# Patient Record
Sex: Male | Born: 1960 | Race: White | Hispanic: No | Marital: Married | Smoking: Current every day smoker
Health system: Southern US, Community
[De-identification: ages and names within clinical notes are randomized; demographics above are authoritative.]

## PROBLEM LIST (undated history)

## (undated) DIAGNOSIS — I639 Cerebral infarction, unspecified: Secondary | ICD-10-CM

## (undated) DIAGNOSIS — M1990 Unspecified osteoarthritis, unspecified site: Secondary | ICD-10-CM

## (undated) DIAGNOSIS — M549 Dorsalgia, unspecified: Secondary | ICD-10-CM

## (undated) DIAGNOSIS — I10 Essential (primary) hypertension: Secondary | ICD-10-CM

## (undated) HISTORY — PX: HERNIA REPAIR: SHX51

## (undated) HISTORY — PX: BACK SURGERY: SHX140

---

## 2017-12-25 ENCOUNTER — Emergency Department (HOSPITAL_BASED_OUTPATIENT_CLINIC_OR_DEPARTMENT_OTHER): Payer: Medicare HMO

## 2017-12-25 ENCOUNTER — Encounter (HOSPITAL_BASED_OUTPATIENT_CLINIC_OR_DEPARTMENT_OTHER): Payer: Self-pay | Admitting: Emergency Medicine

## 2017-12-25 ENCOUNTER — Emergency Department (HOSPITAL_BASED_OUTPATIENT_CLINIC_OR_DEPARTMENT_OTHER)
Admission: EM | Admit: 2017-12-25 | Discharge: 2017-12-25 | Disposition: A | Discharge status: Home / Self Care | Payer: Medicare HMO | Attending: Emergency Medicine | Admitting: Emergency Medicine

## 2017-12-25 ENCOUNTER — Other Ambulatory Visit: Payer: Self-pay

## 2017-12-25 DIAGNOSIS — M25422 Effusion, left elbow: Secondary | ICD-10-CM | POA: Insufficient documentation

## 2017-12-25 DIAGNOSIS — I10 Essential (primary) hypertension: Secondary | ICD-10-CM | POA: Insufficient documentation

## 2017-12-25 DIAGNOSIS — F17200 Nicotine dependence, unspecified, uncomplicated: Secondary | ICD-10-CM | POA: Diagnosis not present

## 2017-12-25 DIAGNOSIS — Z79899 Other long term (current) drug therapy: Secondary | ICD-10-CM | POA: Diagnosis not present

## 2017-12-25 DIAGNOSIS — W19XXXA Unspecified fall, initial encounter: Secondary | ICD-10-CM | POA: Diagnosis not present

## 2017-12-25 DIAGNOSIS — Y999 Unspecified external cause status: Secondary | ICD-10-CM | POA: Diagnosis not present

## 2017-12-25 DIAGNOSIS — R202 Paresthesia of skin: Secondary | ICD-10-CM | POA: Insufficient documentation

## 2017-12-25 DIAGNOSIS — Z7902 Long term (current) use of antithrombotics/antiplatelets: Secondary | ICD-10-CM | POA: Diagnosis not present

## 2017-12-25 DIAGNOSIS — Z8673 Personal history of transient ischemic attack (TIA), and cerebral infarction without residual deficits: Secondary | ICD-10-CM | POA: Insufficient documentation

## 2017-12-25 DIAGNOSIS — Y929 Unspecified place or not applicable: Secondary | ICD-10-CM | POA: Diagnosis not present

## 2017-12-25 DIAGNOSIS — S59902A Unspecified injury of left elbow, initial encounter: Secondary | ICD-10-CM | POA: Diagnosis present

## 2017-12-25 DIAGNOSIS — Y939 Activity, unspecified: Secondary | ICD-10-CM | POA: Diagnosis not present

## 2017-12-25 HISTORY — DX: Cerebral infarction, unspecified: I63.9

## 2017-12-25 HISTORY — DX: Unspecified osteoarthritis, unspecified site: M19.90

## 2017-12-25 HISTORY — DX: Dorsalgia, unspecified: M54.9

## 2017-12-25 HISTORY — DX: Essential (primary) hypertension: I10

## 2017-12-25 MED ORDER — HYDROMORPHONE HCL 1 MG/ML IJ SOLN
2.0000 mg | Freq: Once | INTRAMUSCULAR | Status: AC
  Administered 2017-12-25: 2 mg via INTRAMUSCULAR
  Filled 2017-12-25: qty 2

## 2017-12-25 MED ORDER — OXYCODONE HCL 15 MG PO TABS: 15.0000 mg | ORAL_TABLET | Freq: Four times a day (QID) | ORAL | 0 refills | Status: AC | PRN

## 2017-12-25 NOTE — ED Provider Notes (Signed)
[facility] EMERGENCY DEPARTMENT Provider Note   CSN: [id number] Arrival date & time: 12/25/17  1545     History   Chief Complaint Chief Complaint  Patient presents with  . Fall    HPI Alejandro Hunt is a 57 y.o. male.  Patient with history of stroke, on Plavix, chronic back pain on oxycodone 15mg  4x/day -- presents with acute onset of left elbow pain and swelling starting yesterday after he fell approximately 3-1/2 feet off of a wall onto his elbow.  Patient states that he did not hit his head or lose consciousness.  Patient has had severe pain in his left arm that has been giving him trouble sleeping.  Pain is worse with movement.  He states that his left hand has become more weak since the accident.  He has some tingling in his hands, most pronounced in the index and middle fingers.  Pain in the elbow is worse when he moves his wrist.  He had an x-ray performed by his primary care physician yesterday and he was told to come back for repeat imaging next week.  He was placed in a sling.      Past Medical History:  Diagnosis Date  . Arthritis   . Back pain   . Hypertension   . Stroke [facility])     There are no active problems to display for this patient.   Past Surgical History:  Procedure Laterality Date  . BACK SURGERY    . HERNIA REPAIR          Home Medications    Prior to Admission medications   Medication Sig Start Date End Date Taking? Authorizing Provider  clopidogrel (PLAVIX) 75 MG tablet Take 75 mg by mouth daily.   Yes [provider]  lisinopril (PRINIVIL,ZESTRIL) 5 MG tablet Take 5 mg by mouth daily.   Yes [provider]  oxyCODONE (ROXICODONE) 15 MG immediate release tablet Take 15 mg by mouth 4 (four) times daily as needed for pain.   Yes [provider]  alendronate (FOSAMAX) 70 MG tablet Take 1 tablet by mouth every 7 (seven) days.    [provider]    Family History History reviewed. No pertinent  family history.  Social History Social History   Tobacco Use  . Smoking status: Current Every Day Smoker  . Smokeless tobacco: Never Used  Substance Use Topics  . Alcohol use: Yes    Alcohol/week: 1.8 oz    Types: 3 Cans of beer per week  . Drug use: Never     Allergies   Gabapentin and Lorazepam   Review of Systems Review of Systems  Constitutional: Negative for activity change.  Musculoskeletal: Positive for arthralgias and joint swelling. Negative for back pain, gait problem and neck pain.  Skin: Negative for wound.  Neurological: Positive for weakness and numbness.     Physical Exam Updated Vital Signs BP 125/89 (BP Location: Right Arm)   Pulse (!) 106   Temp 97.9 F (36.6 C) (Oral)   Resp 20   Ht 5\' 8"  (1.727 m)   Wt 63.5 kg (140 lb)   SpO2 97%   BMI 21.29 kg/m   Physical Exam  Constitutional: He appears well-developed and well-nourished.  HENT:  Head: Normocephalic and atraumatic.  Mouth/Throat: Oropharynx is clear and moist.  Eyes: Conjunctivae are normal.  Neck: Normal range of motion. Neck supple.  Cardiovascular: Normal pulses. Exam reveals no decreased pulses.  Pulses:      Radial pulses  are 2+ on the right side, and 2+ on the left side.  Musculoskeletal: He exhibits tenderness. He exhibits no edema.       Left shoulder: Normal.       Left elbow: He exhibits decreased range of motion, swelling and effusion. Tenderness found.       Left wrist: He exhibits tenderness. He exhibits normal range of motion and no bony tenderness.       Left upper arm: Normal.       Left forearm: Normal.       Left hand: Normal.  Patient reports some tingling but no full numbness in index and middle finger of the left hand.  No sensation deficit of the forearm.  Decreased range of motion in flexion and extension of the wrist secondary to pain in the elbow.  Patient has decreased grip strength on this side, also likely secondary to pain.  Neurological: He is alert. No  sensory deficit.  Motor, sensation, and vascular distal to the injury is fully intact.   Skin: Skin is warm and dry.  Psychiatric: He has a normal mood and affect.  Nursing note and vitals reviewed.    ED Treatments / Results  Labs (all labs ordered are listed, but only abnormal results are displayed) Labs Reviewed - No data to display  EKG None  Radiology Dg Elbow Complete Left  Result Date: 12/25/2017 CLINICAL DATA:  Elbow pain with swelling and limited range of motion after a fall EXAM: LEFT ELBOW - COMPLETE 3+ VIEW COMPARISON:  None. FINDINGS: Large elbow effusion. No dislocation. No discrete fracture lucency. IMPRESSION: No discrete fracture lucency is identified, however there is a large elbow effusion and an occult fracture is suspected. Electronically Signed   By: Jasmine Pang M.D.   On: 12/25/2017 16:22    Procedures Procedures (including critical care time)  Medications Ordered in ED Medications  HYDROmorphone (DILAUDID) injection 2 mg (2 mg Intramuscular Given 12/25/17 1642)     Initial Impression / Assessment and Plan / ED Course  I have reviewed the triage vital signs and the nursing notes.  Pertinent labs & imaging results that were available during my care of the patient were reviewed by me and considered in my medical decision making (see chart for details).     Patient seen and examined. Work-up initiated. Medications ordered.   Vital signs reviewed and are as follows: BP 125/89 (BP Location: Right Arm)   Pulse (!) 106   Temp 97.9 F (36.6 C) (Oral)   Resp 20   Ht 5\' 8"  (1.727 m)   Wt 63.5 kg (140 lb)   SpO2 97%   BMI 21.29 kg/m   Patient discussed with and seen by Dr. Eudelia Bunch.  X-ray reviewed.  Given positive sail sign and posterior fat pad, highly suspicious for occult fracture.  For this reason patient will be placed in a posterior long-arm splint.  Patient treated with IM hydromorphone given acute pain and chronic narcotic use.  I reviewed  [place] substance reporting database.  Patient receives pain medication from one provider.   Discussed pain control with Dr. Eudelia Bunch.  Patient is honest about his pain medication use.  Will give 3-day course of pain medication given his acute findings and likely fracture.  Patient feels much better after pain control here.  Patient counseled on use of narcotic pain medications. Counseled not to combine these medications with others containing tylenol. Urged not to drink alcohol, drive, or perform any other activities that requires  focus while taking these medications. The patient verbalizes understanding and agrees with the plan.  I evaluated patient after placement of splint.  He has preserved sensation and capillary refill distally.  Patient given orthopedic referral and will follow up with his primary care doctor on Monday as planned.  Final Clinical Impressions(s) / ED Diagnoses   Final diagnoses:  Effusion of left elbow   Patient with left elbow effusion, likely occult fracture of the elbow.  Patient splinted.  CMS intact distally.  No signs of compartment syndrome.  Normal pulses.  Pain control made difficult due to chronic use of narcotics.  ED Discharge Orders        Ordered    oxyCODONE (ROXICODONE) 15 MG immediate release tablet  Every 6 hours PRN     12/25/17 1730       Renne CriglerGeiple, Florance Paolillo, PA-C 12/25/17 1752

## 2017-12-25 NOTE — Discharge Instructions (Signed)
Please read and follow all provided instructions.  Your diagnoses today include:  1. Effusion of left elbow     Tests performed today include:  An x-ray of the affected area -shows a lot of swelling, no definite broken bones but we have high suspicion for broken bone that we cannot see yet.  Vital signs. See below for your results today.   Medications prescribed:   Oxycodone - narcotic pain medication  DO NOT drive or perform any activities that require you to be awake and alert because this medicine can make you drowsy.   Take any prescribed medications only as directed.  Home care instructions:   Follow any educational materials contained in this packet  Follow R.I.C.E. Protocol:  R - rest your injury   I  - use ice on injury without applying directly to skin  C - compress injury with bandage or splint  E - elevate the injury as much as possible  Follow-up instructions: Please follow-up with your primary care provider or the provided orthopedic physician (bone specialist) next week. In this case you may have a more severe injury that requires further care.  Please let your doctor know about the additional pain medication that we prescribed to you today.  Please take this as prescribed.  Return instructions:   Please return if your fingers are numb or tingling, appear gray or blue, or you have severe pain (also elevate the arm and loosen splint or wrap if you were given one)  Please return to the Emergency Department if you experience worsening symptoms.   Please return if you have any other emergent concerns.  Additional Information:  Your vital signs today were: BP 125/89 (BP Location: Right Arm)    Pulse (!) 106    Temp 97.9 F (36.6 C) (Oral)    Resp 20    Ht 5\' 8"  (1.727 m)    Wt 63.5 kg (140 lb)    SpO2 97%    BMI 21.29 kg/m  If your blood pressure (BP) was elevated above 135/85 this visit, please have this repeated by your doctor within one  month. --------------

## 2017-12-25 NOTE — ED Provider Notes (Signed)
Medical screening examination/treatment/procedure(s) were conducted as a shared visit with non-physician practitioner(s) and myself.  I personally evaluated the patient during the encounter. Briefly, the patient is a 57 y.o. male here with left elbow pain following mech fall. Plain film concerning for fracture. Will splint. Pain related decreased strength. The patient is safe for discharge with strict return precautions. .    EKG Interpretation None           Ha Shannahan, Amadeo GarnetPedro Eduardo, MD 12/25/17 1719

## 2017-12-25 NOTE — ED Notes (Signed)
Pt verbalizes understanding of d/c instructions and denies any further needs at this time. 

## 2017-12-25 NOTE — ED Triage Notes (Signed)
Pt states he fell yesterday-pain to left elbow-was seen by PCP-had xray-"couldn't tell"-is wearing sling-NAD-steady gait

## 2017-12-30 ENCOUNTER — Ambulatory Visit (INDEPENDENT_AMBULATORY_CARE_PROVIDER_SITE_OTHER): Payer: Medicare HMO | Admitting: Orthopaedic Surgery

## 2017-12-30 ENCOUNTER — Encounter (INDEPENDENT_AMBULATORY_CARE_PROVIDER_SITE_OTHER): Payer: Self-pay | Admitting: Orthopaedic Surgery

## 2017-12-30 ENCOUNTER — Ambulatory Visit (INDEPENDENT_AMBULATORY_CARE_PROVIDER_SITE_OTHER): Payer: Medicare HMO

## 2017-12-30 DIAGNOSIS — S52125A Nondisplaced fracture of head of left radius, initial encounter for closed fracture: Secondary | ICD-10-CM

## 2017-12-30 DIAGNOSIS — M25532 Pain in left wrist: Secondary | ICD-10-CM

## 2017-12-30 NOTE — Progress Notes (Signed)
Office Visit Note   Patient: Alejandro HickmanLawrence Faye           Date of Birth: [birth year]           MRN: [id number] Visit Date: 12/30/2017              [facility] [address] [place], [place] [id number] [facility]   Assessment & Plan: Visit Diagnoses:  1. Pain in left wrist   2. Closed nondisplaced fracture of head of left radius, initial encounter     Plan: Impression is nondisplaced radial neck fracture.  He has a large elbow effusion.  I recommend sling immobilization for a total of 10 days or so and then begin gentle range of motion with OT.  I would like him to follow-up in a week with repeat 2 view x-rays of the left elbow.  He will need a referral to OT at that time to begin range of motion.  He needs to be nonweightbearing for 4-6 weeks.  Follow-Up Instructions: Return in about 1 week (around 01/06/2018).   Orders:  Orders Placed This Encounter  Procedures  . XR Wrist Complete Left   No orders of the defined types were placed in this encounter.     Procedures: No procedures performed   Clinical Data: No additional findings.   Subjective: Chief Complaint  Patient presents with  . Left Elbow - Pain    Patient is a 57 year old gentleman who fell onto his left upper extremity on 12/25/2017.  He fell off of a brick wall.  He endorses pain in his left elbow.  Denies any numbness and tingling.  He is taking oxycodone and Motrin.   Review of Systems  Constitutional: Negative.   All other systems reviewed and are negative.    Objective: Vital Signs: There were no vitals taken for this visit.  Physical Exam  Constitutional: He is oriented to person, place, and time. He appears well-developed and well-nourished.  HENT:  Head: Normocephalic and atraumatic.  Eyes: Pupils are equal, round, and reactive to light.  Neck: Neck supple.  Pulmonary/Chest: Effort normal.  Abdominal: Soft.  Musculoskeletal: Normal range of  motion.  Neurological: He is alert and oriented to person, place, and time.  Skin: Skin is warm.  Psychiatric: He has a normal mood and affect. His behavior is normal. Judgment and thought content normal.  Nursing note and vitals reviewed.   Ortho Exam Left elbow exam shows guarding with range of motion testing.  There is no focal motor or sensory deficits.  He is neurovascular intact distally.  Radial head is tender. Specialty Comments:  No specialty comments available.  Imaging: Xr Wrist Complete Left  Result Date: 12/30/2017 No acute or structural abnormalities.    PMFS History: There are no active problems to display for this patient.  Past Medical History:  Diagnosis Date  . Arthritis   . Back pain   . Hypertension   . Stroke [facility])     History reviewed. No pertinent family history.  Past Surgical History:  Procedure Laterality Date  . BACK SURGERY    . HERNIA REPAIR     Social History   Occupational History  . Not on file  Tobacco Use  . Smoking status: Current Every Day Smoker  . Smokeless tobacco: Never Used  Substance and Sexual Activity  . Alcohol use: Yes    Alcohol/week: 1.8 oz    Types: 3 Cans of beer per week  .  Drug use: Never  . Sexual activity: Not on file

## 2018-01-06 ENCOUNTER — Ambulatory Visit (INDEPENDENT_AMBULATORY_CARE_PROVIDER_SITE_OTHER): Payer: Medicare HMO | Admitting: Physician Assistant

## 2018-01-23 ENCOUNTER — Telehealth (INDEPENDENT_AMBULATORY_CARE_PROVIDER_SITE_OTHER): Payer: Self-pay | Admitting: Orthopaedic Surgery

## 2018-01-23 NOTE — Telephone Encounter (Signed)
Kerri with [facility] called needing the 12/30/17 note faxed to her. The fax# is [phone]

## 2018-01-26 NOTE — Telephone Encounter (Signed)
FAXED TO [phone]

## 2020-01-12 IMAGING — CR DG ELBOW COMPLETE LEFT
4 series · 4 of 4 positions shown · non-contrast
Comparison: None.

CLINICAL DATA: Elbow pain with swelling and limited range of motion
after a fall

EXAM:
LEFT ELBOW - COMPLETE 3+ VIEW

[x elbow joint ap left]
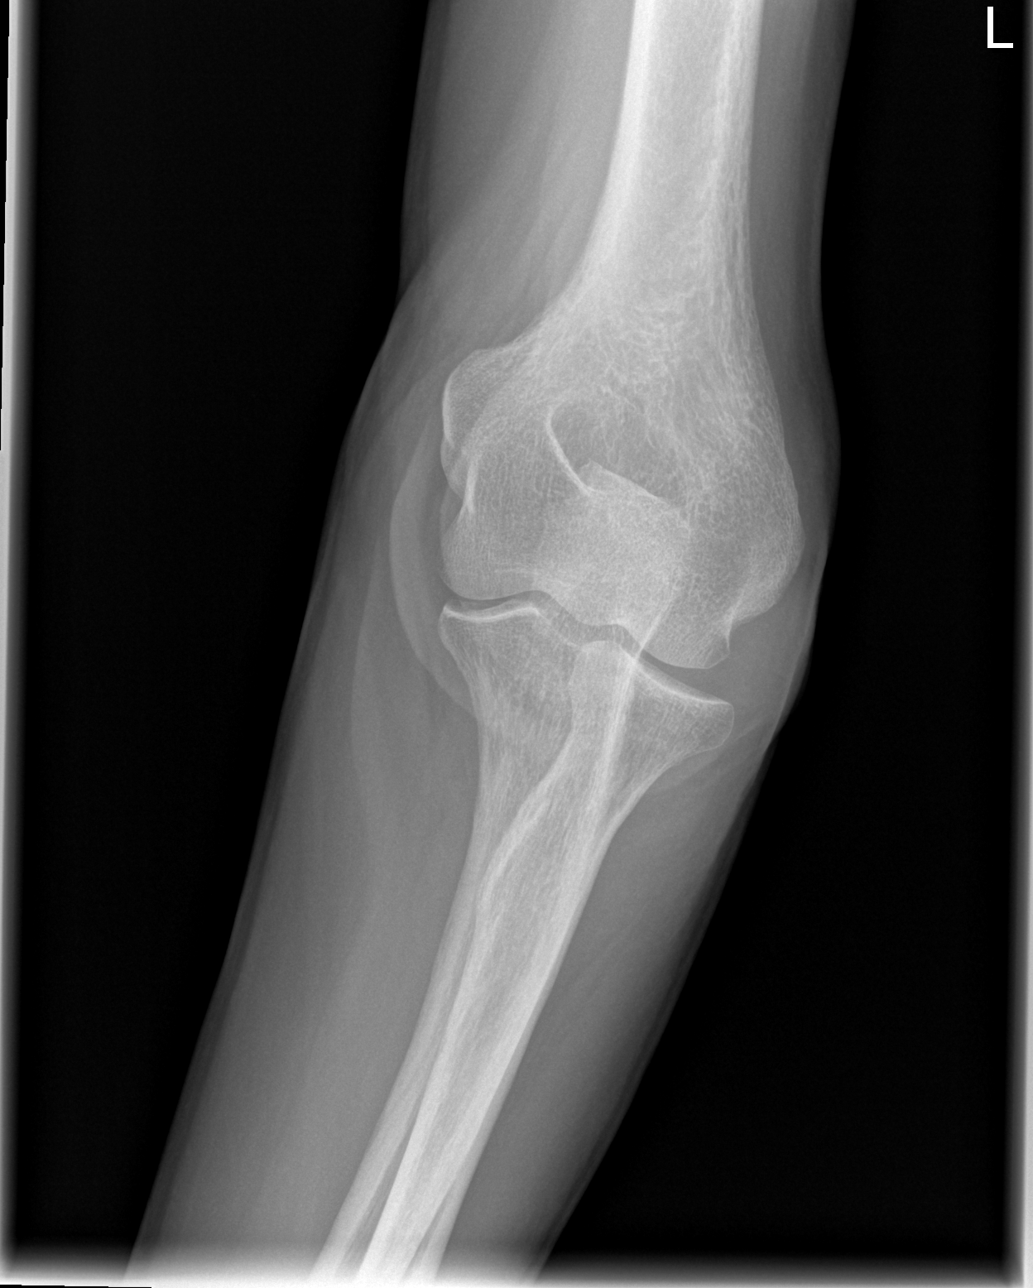

[x elbow joint obl. left (1 of 2)]
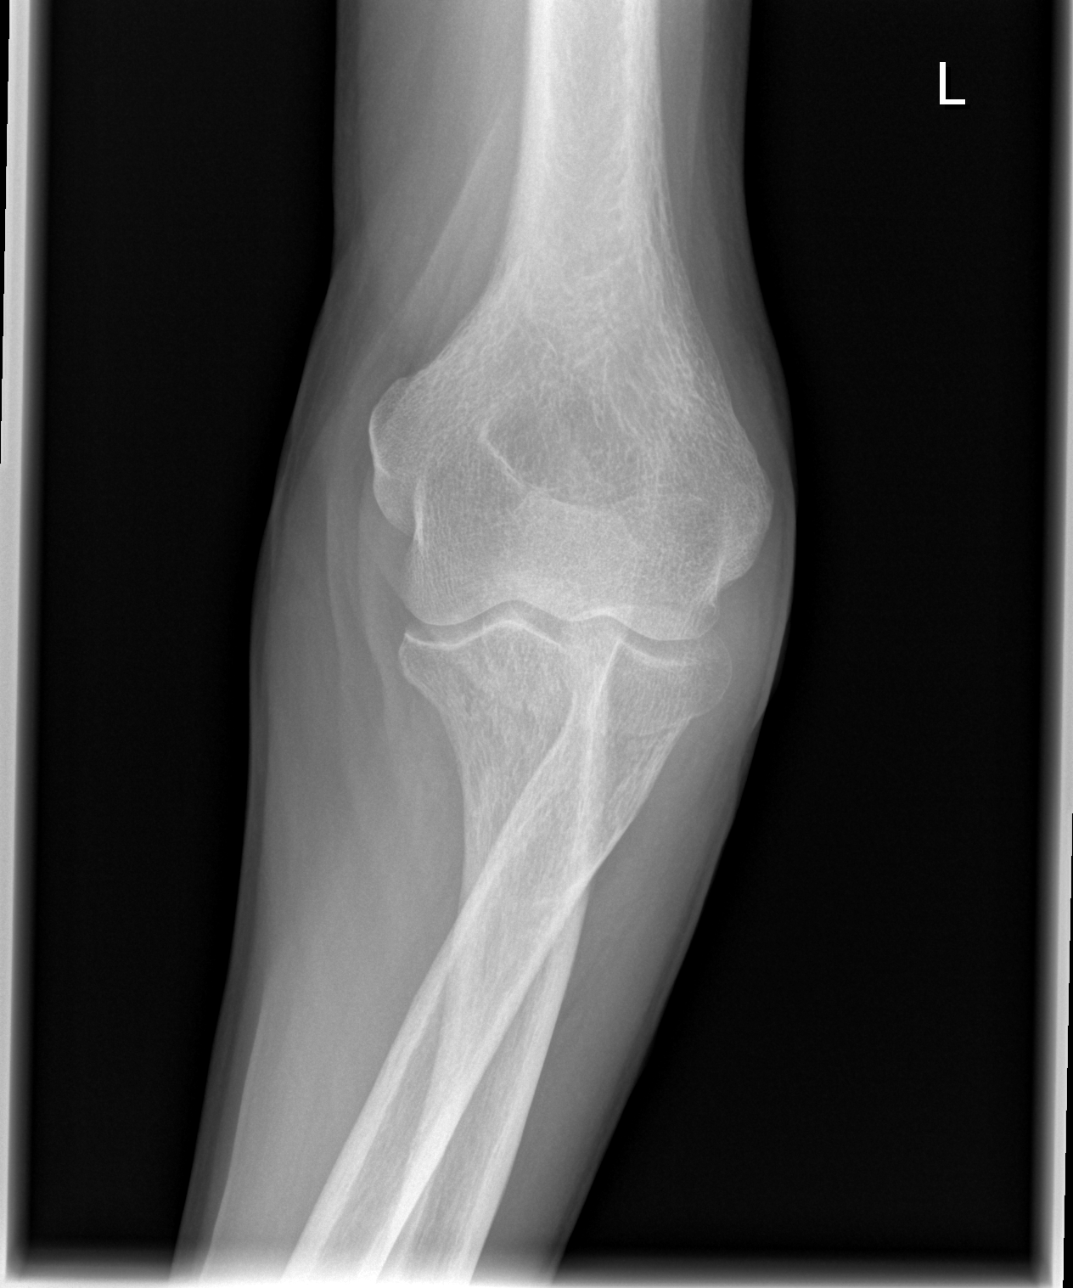

[x elbow joint obl. left (2 of 2)]
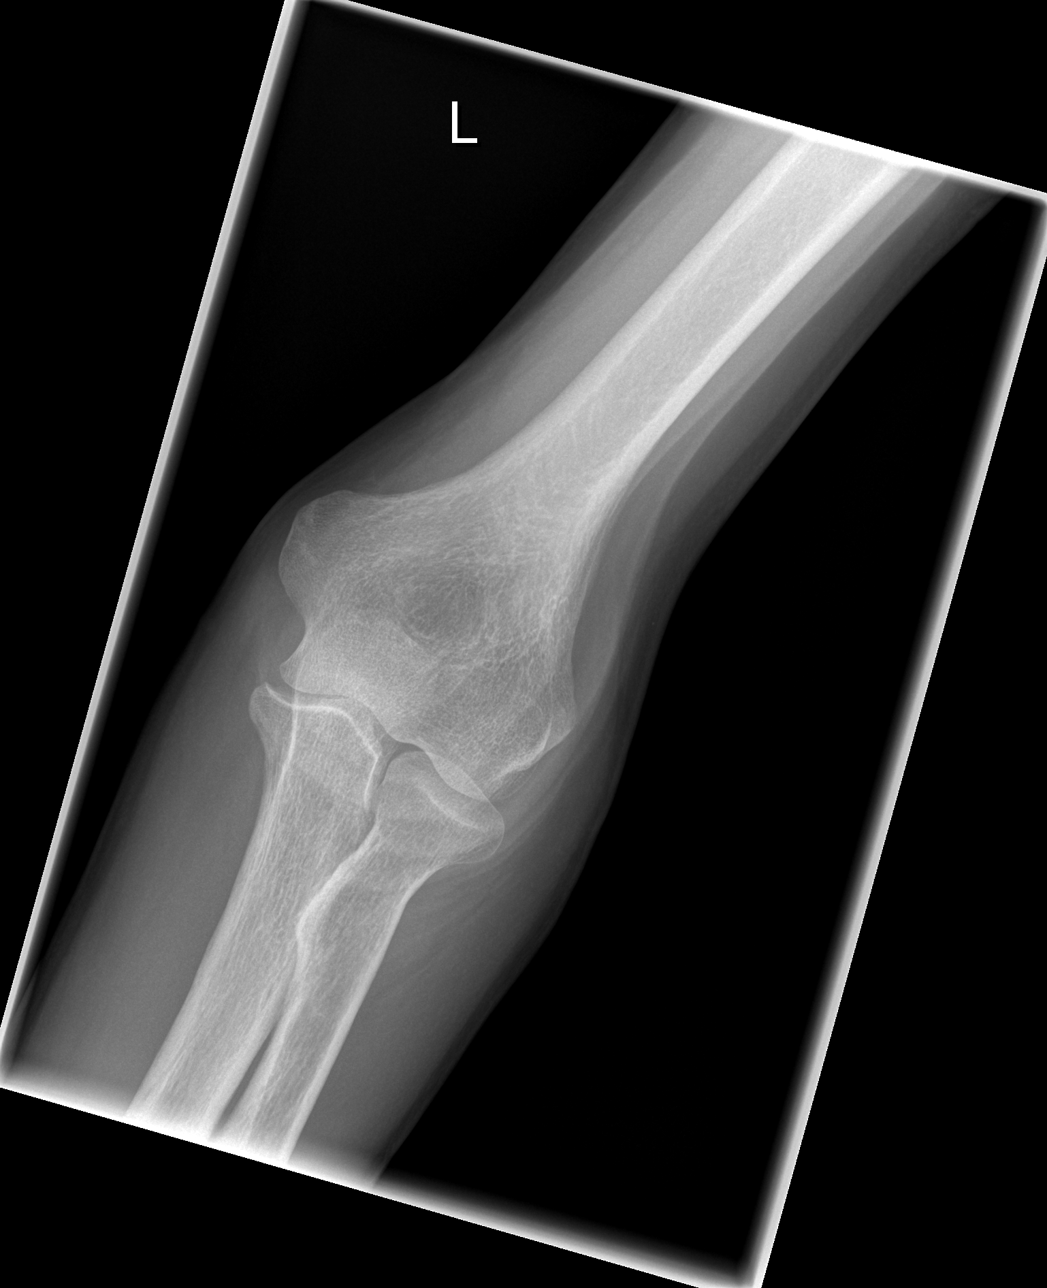

[x elbow joint lat left]
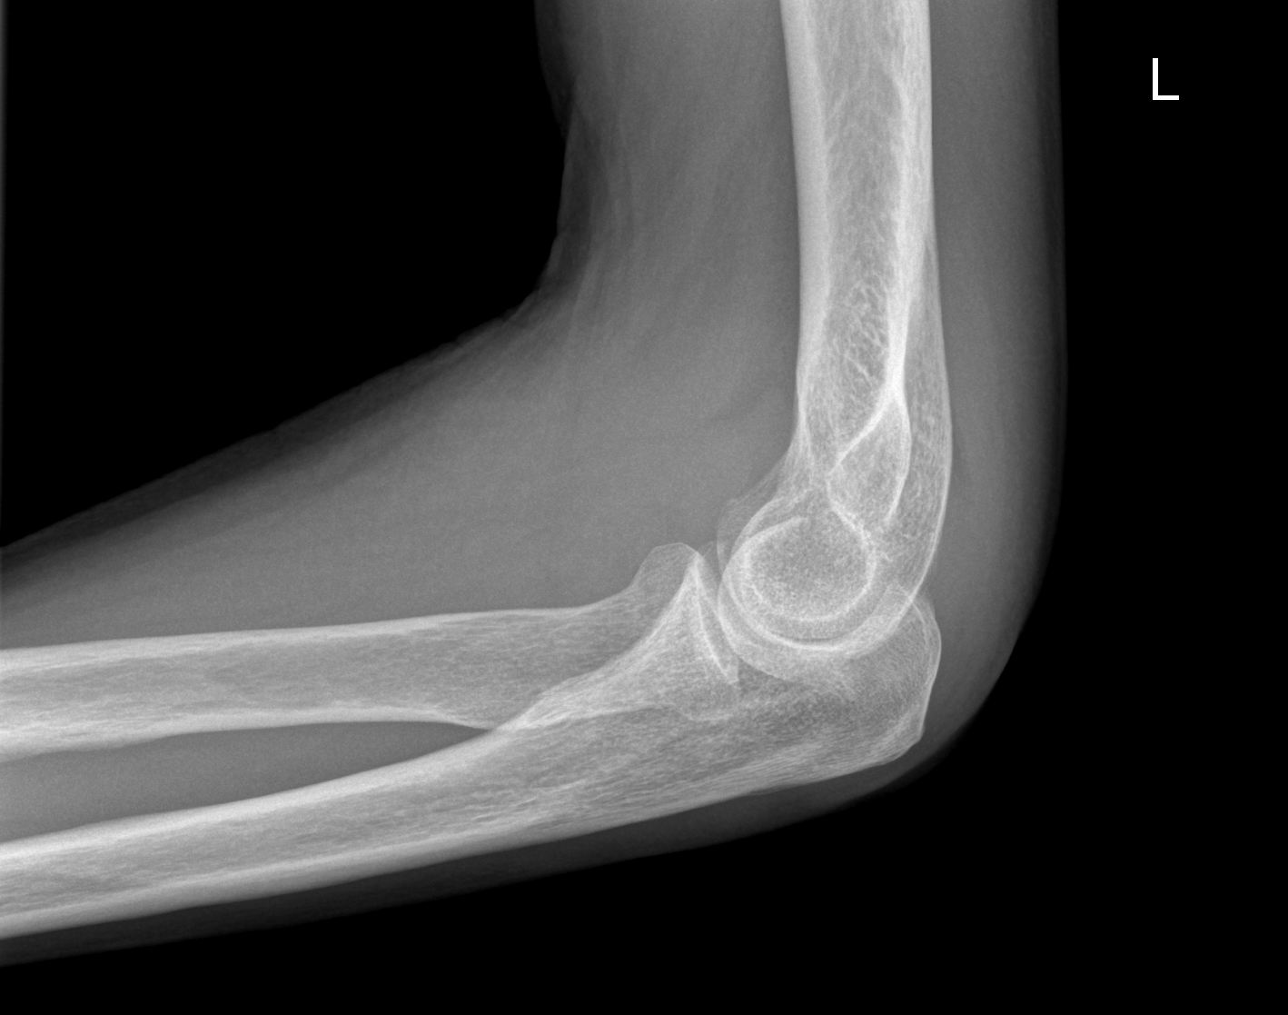

[4 of 4 positions shown; findings below may reference images not displayed]

FINDINGS: Large elbow effusion. No dislocation. No discrete fracture lucency.
IMPRESSION: No discrete fracture lucency is identified, however there is a large
elbow effusion and an occult fracture is suspected.

## 2024-04-16 DEATH — deceased
